# Patient Record
Sex: Female | Born: 1955 | Race: White | Hispanic: No | Marital: Married | Smoking: Never smoker
Health system: Southern US, Community
[De-identification: ages and names within clinical notes are randomized; demographics above are authoritative.]

## PROBLEM LIST (undated history)

## (undated) DIAGNOSIS — B373 Candidiasis of vulva and vagina: Secondary | ICD-10-CM

## (undated) DIAGNOSIS — R569 Unspecified convulsions: Secondary | ICD-10-CM

## (undated) DIAGNOSIS — B3731 Acute candidiasis of vulva and vagina: Secondary | ICD-10-CM

## (undated) DIAGNOSIS — E119 Type 2 diabetes mellitus without complications: Secondary | ICD-10-CM

## (undated) DIAGNOSIS — K219 Gastro-esophageal reflux disease without esophagitis: Secondary | ICD-10-CM

## (undated) DIAGNOSIS — F32A Depression, unspecified: Secondary | ICD-10-CM

## (undated) DIAGNOSIS — Z9114 Patient's other noncompliance with medication regimen: Secondary | ICD-10-CM

## (undated) DIAGNOSIS — F329 Major depressive disorder, single episode, unspecified: Secondary | ICD-10-CM

## (undated) DIAGNOSIS — I10 Essential (primary) hypertension: Secondary | ICD-10-CM

## (undated) DIAGNOSIS — Z91148 Patient's other noncompliance with medication regimen for other reason: Secondary | ICD-10-CM

## (undated) DIAGNOSIS — E785 Hyperlipidemia, unspecified: Secondary | ICD-10-CM

## (undated) DIAGNOSIS — K851 Biliary acute pancreatitis without necrosis or infection: Secondary | ICD-10-CM

## (undated) DIAGNOSIS — M5020 Other cervical disc displacement, unspecified cervical region: Secondary | ICD-10-CM

## (undated) DIAGNOSIS — F419 Anxiety disorder, unspecified: Secondary | ICD-10-CM

## (undated) HISTORY — DX: Major depressive disorder, single episode, unspecified: F32.9

## (undated) HISTORY — PX: TUBAL LIGATION: SHX77

## (undated) HISTORY — DX: Type 2 diabetes mellitus without complications: E11.9

## (undated) HISTORY — DX: Essential (primary) hypertension: I10

## (undated) HISTORY — DX: Patient's other noncompliance with medication regimen for other reason: Z91.148

## (undated) HISTORY — DX: Candidiasis of vulva and vagina: B37.3

## (undated) HISTORY — DX: Depression, unspecified: F32.A

## (undated) HISTORY — DX: Hyperlipidemia, unspecified: E78.5

## (undated) HISTORY — DX: Anxiety disorder, unspecified: F41.9

## (undated) HISTORY — DX: Other cervical disc displacement, unspecified cervical region: M50.20

## (undated) HISTORY — DX: Biliary acute pancreatitis without necrosis or infection: K85.10

## (undated) HISTORY — DX: Unspecified convulsions: R56.9

## (undated) HISTORY — DX: Gastro-esophageal reflux disease without esophagitis: K21.9

## (undated) HISTORY — PX: CHOLECYSTECTOMY: SHX55

## (undated) HISTORY — DX: Patient's other noncompliance with medication regimen: Z91.14

## (undated) HISTORY — DX: Acute candidiasis of vulva and vagina: B37.31

---

## 2004-07-10 ENCOUNTER — Ambulatory Visit: Payer: Self-pay | Admitting: Obstetrics and Gynecology

## 2006-03-17 ENCOUNTER — Ambulatory Visit: Payer: Self-pay | Admitting: Family Medicine

## 2006-06-21 ENCOUNTER — Ambulatory Visit: Payer: Self-pay | Admitting: Family Medicine

## 2007-03-09 ENCOUNTER — Other Ambulatory Visit: Payer: Self-pay

## 2007-03-10 ENCOUNTER — Inpatient Hospital Stay: Payer: Self-pay | Admitting: Internal Medicine

## 2009-03-20 ENCOUNTER — Ambulatory Visit: Payer: Self-pay

## 2011-03-17 ENCOUNTER — Ambulatory Visit: Payer: Self-pay | Admitting: Family Medicine

## 2011-10-26 ENCOUNTER — Ambulatory Visit: Payer: Self-pay | Admitting: Family Medicine

## 2013-03-26 ENCOUNTER — Ambulatory Visit: Payer: Self-pay | Admitting: Family Medicine

## 2013-11-27 ENCOUNTER — Emergency Department: Payer: Self-pay | Admitting: Internal Medicine

## 2013-11-27 LAB — CBC AND DIFFERENTIAL
HEMATOCRIT: 41 % (ref 36–46)
Hemoglobin: 13.6 g/dL (ref 12.0–16.0)
PLATELETS: 142 10*3/uL — AB (ref 150–399)
WBC: 12.9 10^3/mL

## 2013-11-27 LAB — COMPREHENSIVE METABOLIC PANEL
ALBUMIN: 4.3 g/dL (ref 3.4–5.0)
ALK PHOS: 53 U/L
Anion Gap: 13 (ref 7–16)
BILIRUBIN TOTAL: 0.4 mg/dL (ref 0.2–1.0)
BUN: 12 mg/dL (ref 7–18)
CALCIUM: 9 mg/dL (ref 8.5–10.1)
CHLORIDE: 102 mmol/L (ref 98–107)
Co2: 22 mmol/L (ref 21–32)
Creatinine: 1.18 mg/dL (ref 0.60–1.30)
EGFR (Non-African Amer.): 51 — ABNORMAL LOW
Glucose: 96 mg/dL (ref 65–99)
Osmolality: 273 (ref 275–301)
Potassium: 4 mmol/L (ref 3.5–5.1)
SGOT(AST): 23 U/L (ref 15–37)
SGPT (ALT): 36 U/L
SODIUM: 137 mmol/L (ref 136–145)
Total Protein: 7.8 g/dL (ref 6.4–8.2)

## 2013-11-27 LAB — URINALYSIS, COMPLETE
Bacteria: NONE SEEN
Bilirubin,UR: NEGATIVE
Blood: NEGATIVE
GLUCOSE, UR: NEGATIVE mg/dL (ref 0–75)
Hyaline Cast: 21
LEUKOCYTE ESTERASE: NEGATIVE
Nitrite: NEGATIVE
Ph: 5 (ref 4.5–8.0)
RBC,UR: 1 /HPF (ref 0–5)
Specific Gravity: 1.025 (ref 1.003–1.030)
WBC UR: 2 /HPF (ref 0–5)

## 2013-11-27 LAB — DRUG SCREEN, URINE
Amphetamines, Ur Screen: NEGATIVE (ref ?–1000)
BARBITURATES, UR SCREEN: NEGATIVE (ref ?–200)
Benzodiazepine, Ur Scrn: NEGATIVE (ref ?–200)
CANNABINOID 50 NG, UR ~~LOC~~: POSITIVE (ref ?–50)
Cocaine Metabolite,Ur ~~LOC~~: NEGATIVE (ref ?–300)
MDMA (Ecstasy)Ur Screen: POSITIVE (ref ?–500)
Methadone, Ur Screen: NEGATIVE (ref ?–300)
Opiate, Ur Screen: NEGATIVE (ref ?–300)
Phencyclidine (PCP) Ur S: NEGATIVE (ref ?–25)
TRICYCLIC, UR SCREEN: NEGATIVE (ref ?–1000)

## 2013-11-27 LAB — BASIC METABOLIC PANEL
BUN: 12 mg/dL (ref 4–21)
CREATININE: 1.1 mg/dL (ref <=1.1)
Glucose: 96 mg/dL
POTASSIUM: 4 mmol/L (ref 3.4–5.3)
SODIUM: 137 mmol/L (ref 137–147)

## 2013-11-27 LAB — CBC
HCT: 41.3 % (ref 35.0–47.0)
HGB: 13.6 g/dL (ref 12.0–16.0)
MCH: 30.1 pg (ref 26.0–34.0)
MCHC: 33 g/dL (ref 32.0–36.0)
MCV: 91 fL (ref 80–100)
PLATELETS: 263 10*3/uL (ref 150–440)
RBC: 4.52 10*6/uL (ref 3.80–5.20)
RDW: 12.6 % (ref 11.5–14.5)
WBC: 12.9 10*3/uL — ABNORMAL HIGH (ref 3.6–11.0)

## 2013-11-27 LAB — ACETAMINOPHEN LEVEL: Acetaminophen: <2 ug/mL

## 2013-11-27 LAB — HEPATIC FUNCTION PANEL
Alkaline Phosphatase: 53 U/L (ref 25–125)
Bilirubin, Total: 0.4 mg/dL

## 2013-11-27 LAB — ETHANOL

## 2013-11-27 LAB — TROPONIN I: Troponin-I: <0.02 ng/mL

## 2013-11-27 LAB — LIPASE, BLOOD: Lipase: 142 U/L (ref 73–393)

## 2013-11-27 LAB — SALICYLATE LEVEL: SALICYLATES, SERUM: 3.7 mg/dL — AB

## 2013-12-06 LAB — HEMOGLOBIN A1C: Hgb A1c MFr Bld: 6.5 % — AB (ref 4.0–6.0)

## 2014-01-07 ENCOUNTER — Emergency Department: Payer: Self-pay | Admitting: Emergency Medicine

## 2014-01-07 LAB — CBC WITH DIFFERENTIAL/PLATELET
Basophil #: 0.1 10*3/uL (ref 0.0–0.1)
Basophil %: 0.5 %
Eosinophil #: 0.2 10*3/uL (ref 0.0–0.7)
Eosinophil %: 2.4 %
HCT: 43.1 % (ref 35.0–47.0)
HGB: 14 g/dL (ref 12.0–16.0)
Lymphocyte #: 4.1 10*3/uL — ABNORMAL HIGH (ref 1.0–3.6)
Lymphocyte %: 41.3 %
MCH: 30.1 pg (ref 26.0–34.0)
MCHC: 32.5 g/dL (ref 32.0–36.0)
MCV: 93 fL (ref 80–100)
Monocyte #: 0.7 x10 3/mm (ref 0.2–0.9)
Monocyte %: 7.2 %
NEUTROS ABS: 4.8 10*3/uL (ref 1.4–6.5)
NEUTROS PCT: 48.6 %
PLATELETS: 237 10*3/uL (ref 150–440)
RBC: 4.66 10*6/uL (ref 3.80–5.20)
RDW: 12.6 % (ref 11.5–14.5)
WBC: 9.9 10*3/uL (ref 3.6–11.0)

## 2014-01-07 LAB — BASIC METABOLIC PANEL
ANION GAP: 13 (ref 7–16)
BUN: 11 mg/dL (ref 7–18)
CO2: 24 mmol/L (ref 21–32)
Calcium, Total: 9.3 mg/dL (ref 8.5–10.1)
Chloride: 101 mmol/L (ref 98–107)
Creatinine: 1.1 mg/dL (ref 0.60–1.30)
EGFR (African American): >60
EGFR (Non-African Amer.): 55 — ABNORMAL LOW
GLUCOSE: 116 mg/dL — AB (ref 65–99)
Osmolality: 276 (ref 275–301)
Potassium: 3.6 mmol/L (ref 3.5–5.1)
Sodium: 138 mmol/L (ref 136–145)

## 2014-01-15 ENCOUNTER — Inpatient Hospital Stay: Payer: Self-pay | Admitting: Internal Medicine

## 2014-01-15 ENCOUNTER — Ambulatory Visit: Payer: Self-pay | Admitting: Family Medicine

## 2014-01-15 LAB — COMPREHENSIVE METABOLIC PANEL
ALK PHOS: 54 U/L
Albumin: 4.2 g/dL (ref 3.4–5.0)
Anion Gap: 13 (ref 7–16)
BILIRUBIN TOTAL: 0.4 mg/dL (ref 0.2–1.0)
BUN: 13 mg/dL (ref 7–18)
CALCIUM: 9.6 mg/dL (ref 8.5–10.1)
CHLORIDE: 96 mmol/L — AB (ref 98–107)
CO2: 27 mmol/L (ref 21–32)
Creatinine: 0.9 mg/dL (ref 0.60–1.30)
GLUCOSE: 141 mg/dL — AB (ref 65–99)
OSMOLALITY: 274 (ref 275–301)
Potassium: 4.1 mmol/L (ref 3.5–5.1)
SGOT(AST): 29 U/L (ref 15–37)
SGPT (ALT): 41 U/L
Sodium: 136 mmol/L (ref 136–145)
Total Protein: 8.3 g/dL — ABNORMAL HIGH (ref 6.4–8.2)

## 2014-01-15 LAB — CBC WITH DIFFERENTIAL/PLATELET
Basophil #: 0.1 10*3/uL (ref 0.0–0.1)
Basophil %: 0.6 %
EOS PCT: 2.4 %
Eosinophil #: 0.2 10*3/uL (ref 0.0–0.7)
HCT: 46.5 % (ref 35.0–47.0)
HGB: 15.5 g/dL (ref 12.0–16.0)
LYMPHS ABS: 3.3 10*3/uL (ref 1.0–3.6)
Lymphocyte %: 38 %
MCH: 29.9 pg (ref 26.0–34.0)
MCHC: 33.4 g/dL (ref 32.0–36.0)
MCV: 90 fL (ref 80–100)
Monocyte #: 0.6 x10 3/mm (ref 0.2–0.9)
Monocyte %: 7.3 %
NEUTROS ABS: 4.5 10*3/uL (ref 1.4–6.5)
Neutrophil %: 51.7 %
PLATELETS: 244 10*3/uL (ref 150–440)
RBC: 5.19 10*6/uL (ref 3.80–5.20)
RDW: 12.2 % (ref 11.5–14.5)
WBC: 8.7 10*3/uL (ref 3.6–11.0)

## 2014-01-15 LAB — URINALYSIS, COMPLETE
Bilirubin,UR: NEGATIVE
Blood: NEGATIVE
Glucose,UR: NEGATIVE
KETONE: NEGATIVE
Leukocyte Esterase: NEGATIVE
Nitrite: NEGATIVE
PH: 5.5 (ref 5.0–8.0)
Specific Gravity: >=1.03 (ref 1.000–1.030)

## 2014-01-15 LAB — LIPASE, BLOOD: Lipase: 516 U/L — ABNORMAL HIGH (ref 73–393)

## 2014-01-16 LAB — BASIC METABOLIC PANEL
ANION GAP: 4 — AB (ref 7–16)
BUN: 13 mg/dL (ref 7–18)
Calcium, Total: 8.2 mg/dL — ABNORMAL LOW (ref 8.5–10.1)
Chloride: 104 mmol/L (ref 98–107)
Co2: 30 mmol/L (ref 21–32)
Creatinine: 0.9 mg/dL (ref 0.60–1.30)
EGFR (African American): >60
EGFR (Non-African Amer.): >60
GLUCOSE: 96 mg/dL (ref 65–99)
Osmolality: 276 (ref 275–301)
Potassium: 3.8 mmol/L (ref 3.5–5.1)
SODIUM: 138 mmol/L (ref 136–145)

## 2014-01-16 LAB — LIPASE, BLOOD: Lipase: 69 U/L — ABNORMAL LOW (ref 73–393)

## 2014-01-21 ENCOUNTER — Observation Stay: Payer: Self-pay | Admitting: Surgery

## 2014-01-21 LAB — COMPREHENSIVE METABOLIC PANEL
ALBUMIN: 3.8 g/dL (ref 3.4–5.0)
ALK PHOS: 46 U/L
Anion Gap: 7 (ref 7–16)
BUN: 9 mg/dL (ref 7–18)
Bilirubin,Total: 0.4 mg/dL (ref 0.2–1.0)
Calcium, Total: 9.3 mg/dL (ref 8.5–10.1)
Chloride: 101 mmol/L (ref 98–107)
Co2: 29 mmol/L (ref 21–32)
Creatinine: 0.81 mg/dL (ref 0.60–1.30)
EGFR (African American): >60
EGFR (Non-African Amer.): >60
GLUCOSE: 110 mg/dL — AB (ref 65–99)
OSMOLALITY: 273 (ref 275–301)
POTASSIUM: 4 mmol/L (ref 3.5–5.1)
SGOT(AST): 27 U/L (ref 15–37)
SGPT (ALT): 35 U/L
Sodium: 137 mmol/L (ref 136–145)
Total Protein: 7.8 g/dL (ref 6.4–8.2)

## 2014-01-21 LAB — CBC WITH DIFFERENTIAL/PLATELET
Basophil #: 0.1 10*3/uL (ref 0.0–0.1)
Basophil %: 0.9 %
EOS ABS: 0.2 10*3/uL (ref 0.0–0.7)
Eosinophil %: 2.9 %
HCT: 43.5 % (ref 35.0–47.0)
HGB: 14.7 g/dL (ref 12.0–16.0)
LYMPHS ABS: 3 10*3/uL (ref 1.0–3.6)
LYMPHS PCT: 43.8 %
MCH: 30.5 pg (ref 26.0–34.0)
MCHC: 33.8 g/dL (ref 32.0–36.0)
MCV: 90 fL (ref 80–100)
MONO ABS: 0.5 x10 3/mm (ref 0.2–0.9)
Monocyte %: 7.5 %
NEUTROS PCT: 44.9 %
Neutrophil #: 3.1 10*3/uL (ref 1.4–6.5)
PLATELETS: 231 10*3/uL (ref 150–440)
RBC: 4.81 10*6/uL (ref 3.80–5.20)
RDW: 12.6 % (ref 11.5–14.5)
WBC: 7 10*3/uL (ref 3.6–11.0)

## 2014-01-21 LAB — URINALYSIS, COMPLETE
BACTERIA: NONE SEEN
BLOOD: NEGATIVE
Bilirubin,UR: NEGATIVE
Glucose,UR: NEGATIVE mg/dL (ref 0–75)
Ketone: NEGATIVE
Leukocyte Esterase: NEGATIVE
Nitrite: NEGATIVE
PROTEIN: NEGATIVE
Ph: 5 (ref 4.5–8.0)
Specific Gravity: 1.013 (ref 1.003–1.030)
Squamous Epithelial: 2
WBC UR: 1 /HPF (ref 0–5)

## 2014-01-21 LAB — LIPASE, BLOOD: LIPASE: 79 U/L (ref 73–393)

## 2014-02-27 ENCOUNTER — Encounter (INDEPENDENT_AMBULATORY_CARE_PROVIDER_SITE_OTHER): Payer: Self-pay

## 2014-02-27 ENCOUNTER — Telehealth: Payer: Self-pay

## 2014-02-27 ENCOUNTER — Ambulatory Visit (INDEPENDENT_AMBULATORY_CARE_PROVIDER_SITE_OTHER): Payer: No Typology Code available for payment source | Admitting: Endocrinology

## 2014-02-27 ENCOUNTER — Encounter: Payer: Self-pay | Admitting: Endocrinology

## 2014-02-27 VITALS — BP 134/82 | HR 65 | Ht 67.0 in | Wt 222.2 lb

## 2014-02-27 DIAGNOSIS — I10 Essential (primary) hypertension: Secondary | ICD-10-CM

## 2014-02-27 DIAGNOSIS — E119 Type 2 diabetes mellitus without complications: Secondary | ICD-10-CM

## 2014-02-27 LAB — COMPREHENSIVE METABOLIC PANEL
ALK PHOS: 50 U/L (ref 39–117)
ALT: 21 U/L (ref 0–35)
AST: 20 U/L (ref 0–37)
Albumin: 4.6 g/dL (ref 3.5–5.2)
BUN: 10 mg/dL (ref 6–23)
CO2: 31 meq/L (ref 19–32)
CREATININE: 0.68 mg/dL (ref 0.40–1.20)
Calcium: 10.1 mg/dL (ref 8.4–10.5)
Chloride: 102 mEq/L (ref 96–112)
GFR: 95.07 mL/min (ref >60.00)
Glucose, Bld: 48 mg/dL — CL (ref 70–99)
Potassium: 4.2 mEq/L (ref 3.5–5.1)
Sodium: 139 mEq/L (ref 135–145)
Total Bilirubin: 0.4 mg/dL (ref 0.2–1.2)
Total Protein: 7.5 g/dL (ref 6.0–8.3)

## 2014-02-27 LAB — HM DIABETES FOOT EXAM: HM Diabetic Foot Exam: NORMAL

## 2014-02-27 MED ORDER — METFORMIN HCL ER (MOD) 1000 MG PO TB24: 1000.0000 mg | ORAL_TABLET | Freq: Every day | ORAL | Status: DC

## 2014-02-27 NOTE — Progress Notes (Signed)
Reason for visit-  Sharon Crawford is a 59 y.o.-year-old female, referred by her PCP,  Dr Otilio Miu, for management of Type 2 diabetes, uncontrolled, without complications. Recent history of gallstone pancreatitis Dec 2015, following which she had CCY. Recent Seizures of unclear etiology as well, for which she is being followed by neurology. Here with husband.   HPI- Patient has been diagnosed with diabetes in 2006. Recalls being initially on lifestyle modifications. Prior hx non compliance.   she has not been on insulin before.   Was having GI upset , worse in past 2 years>>seizure Nov 2015 with no clear precipitant, ?mild hypoglycemia that time ( 69, 80s)>> then developed right sided abdominal pain with pancreatitis, gall stones  >>underwent GB surgery Dec 2015,>>surgery recommended restarting met, glip jan 2016 ( meds were held during hospitalization) and since then has not been tolerating the medication Sees Dr Gurney Maxin at [facility] neurology for seizures Developed Depression after her drivers license got taken away.     Pt is currently on a regimen of: - Metformin 1000 mg po bid - Glipizide 10 mg BID * GI upset, bloating, cramping , made worse after CCY surgery *uses marijuana daily smokes for nausea, relaxation    Last hemoglobin A1c was: Lab Results  Component Value Date   HGBA1C 6.5* 12/06/2013     Pt checks her sugars <1 a day ( when symptoms)  . Uses [employer] glucometer. By recall they are:  PREMEAL Breakfast Lunch Dinner Bedtime Overall  Glucose range: 80-125   80-300s   Mean/median:        POST-MEAL PC Breakfast PC Lunch PC Dinner  Glucose range:  175-180   Mean/median:       Hypoglycemia-  No lows. Lowest sugar was 70s-Dec 2015 around the time of seizures- happens early pm; she has hypoglycemia awareness at 70.   Dietary habits- eats three times daily.  Gets busy and forgets to eat lunch time- now getting better. Eating fewer carbs lately. Not  controlling portions on desserts and almost has something daily.  Exercise- not lately Weight -  Wt Readings from Last 3 Encounters:  02/27/14 222 lb 4 oz (100.812 kg)    Diabetes Complications-  Nephropathy- No  CKD, last BUN/creatinine-  Lab Results  Component Value Date   BUN 10 02/27/2014   CREATININE 0.68 02/27/2014   Lab Results  Component Value Date   GFR 95.07 02/27/2014     Retinopathy- No, Last DEE was in 2015 Neuropathy- occ numbness and tingling in her feet. No known neuropathy.  Associated history - No CAD . No prior stroke. No hypothyroidism. her last TSH was No results found for: TSH  Hyperlipidemia-  her last set of lipids were- Currently on dietary therapy. Tolerating well.  No results found for: CHOL, HDL, LDLCALC, LDLDIRECT, TRIG, CHOLHDL  Blood Pressure/HTN- Patient's blood pressure is well controlled today on current regimen that includes ACE-I.  Pt has FH of DM in father, sisters, brother.  I have reviewed the patient's past medical history, family and social history, surgical history, medications and allergies.  Past Medical History  Diagnosis Date  . Diabetes mellitus without complication   . Hyperlipidemia   . Anxiety   . Hypertension   . Seizures   . GERD (gastroesophageal reflux disease)   . Depression   . Noncompliance with medication regimen   . Vulvovaginal candidiasis   . Cervical disc displacement   . Gallstone pancreatitis    Past Surgical History  Procedure  Laterality Date  . Cholecystectomy    . Tubal ligation     Family History  Problem Relation Age of Onset  . Diabetes Father   . Diabetes Sister   . Diabetes Brother    History   Social History  . Marital Status: Married    Spouse Name: N/A    Number of Children: N/A  . Years of Education: N/A   Occupational History  . Not on file.   Social History Main Topics  . Smoking status: Never Smoker   . Smokeless tobacco: Never Used  . Alcohol Use: No  . Drug Use: 7.00  per week    Special: Marijuana     Comment: patient reports smoke marijuana daily  . Sexual Activity: Not on file   Other Topics Concern  . Not on file   Social History Narrative   No current outpatient prescriptions on file prior to visit.   No current facility-administered medications on file prior to visit.   Allergies  Allergen Reactions  . Trazodone     Other reaction(s): Other (See Comments) Insomnia     Review of Systems: _0  complains of  [  ] denies General:   [ x ] Recent weight change [ x ] Fatigue  [  ] Loss of appetite Eyes: [ x ]  Vision Difficulty [  ]  Eye pain ENT: [  ]  Hearing difficulty [  ]  Difficulty Swallowing CVS: [  ] Chest pain [  ]  Palpitations/Irregular Heart beat [  ]  Shortness of breath lying flat [  ] Swelling of legs Resp: [  ] Frequent Cough [  ] Shortness of Breath  [  ]  Wheezing GI: [ x ] Heartburn  [  x] Nausea or Vomiting  [  ] Diarrhea [  ] Constipation  [ x ] Abdominal Pain GU: [  ]  Polyuria  [ x ]  nocturia Bones/joints:  [  ]  Muscle aches  [ x ] Joint Pain  [  ] Bone pain Skin/Hair/Nails: [  ]  Rash  [  ] New stretch marks [  ]  Itching [ x ] Hair loss [  ]  Excessive hair growth Reproduction: [ x ] Low sexual desire , [  ]  Women: Menstrual cycle problems [  ]  Women: Breast Discharge [  ] Men: Difficulty with erections [  ]  Men: Enlarged Breasts CNS: [  ] Frequent Headaches [  ] Blurry vision [ x ] Tremors [ x ] Seizures [ x ] Loss of consciousness [  ] Localized weakness Endocrine: [  ]  Excess thirst [  ]  Feeling excessively hot [  ]  Feeling excessively cold Heme: [  ]  Easy bruising [  ]  Enlarged glands or lumps in neck Allergy: [  ]  Food allergies [  ] Environmental allergies  PE: BP 134/82 mmHg  Pulse 65  Ht _1  (1.702 m)  Wt 222 lb 4 oz (100.812 kg)  BMI 34.80 kg/m2  SpO2 97% Wt Readings from Last 3 Encounters:  02/27/14 222 lb 4 oz (100.812 kg)   GENERAL: No acute distress, well developed HEENT:  Eye exam  shows normal external appearance. Oral exam shows normal mucosa .  NECK:   Neck exam shows no lymphadenopathy. No Carotids bruits. Thyroid is not enlarged and no nodules felt.  no acanthosis nigricans LUNGS:  Chest is symmetrical. Lungs are clear to auscultation.Marland Kitchen   HEART:         Heart sounds:  S1 and S2 are normal. Systolic murmur+ ABDOMEN:  No Distention present. Liver and spleen are not palpable. No other mass or tenderness present.  EXTREMITIES:     There is no edema. 2+ DP pulses  NEUROLOGICAL:     Grossly intact.            Diabetic foot exam done with shoes and socks removed: Normal Monofilament testing bilaterally. No deformities of toes.  Nails  Not dystrophic. Skin normal color. No open wounds. Dry skin. Few calluses. MUSCULOSKELETAL:       There is no enlargement or gross deformity of the joints.  SKIN:       No rash  ASSESSMENT AND PLAN: Problem List Items Addressed This Visit      Cardiovascular and Mediastinum   Essential hypertension    BP at target today. Will assess urine MA at next few visits.        Endocrine   Type 2 diabetes mellitus - Primary    Discussed pathophysiology of DM, goal sugars, A1c, long term risks of uncontrolled DM.  Discussed dietary modifications, exercise, weight loss.  Foot care, eye exams and hypoglycemia recognition and treatment.  Asked her to check her sugars 2x daily.  She hasn't been tolerating the metformin specially after her CCY, and I will change that to [facility] to see if she tolerates that better.  Can continue Glipizide at current dose for now, but worried that she has unrecognized hypoglycemia and if she does not note any lows, then she is aware to notify me for further adjustments.  Try controling portions for desserts and add walk every day.   RTC 1 month      Relevant Medications   metFORMIN (GLUMETZA) 24 hr tablet   Other Relevant Orders   Comprehensive metabolic panel (Completed)        - Return to clinic  in 1 mo with sugar log/meter. 45 minutes spent with the patient and her husband, discussing above topics>50% time.   Kamilla Hands [facility] 02/28/2014 9:38 AM

## 2014-02-27 NOTE — Telephone Encounter (Signed)
She is not tolerating the metformin. Need glumetza. Do we need to do a PA form?

## 2014-02-27 NOTE — Progress Notes (Signed)
Pre visit review using our clinic review tool, if applicable. No additional management support is needed unless otherwise documented below in the visit note. 

## 2014-02-27 NOTE — Telephone Encounter (Signed)
Received a call from [facility] pharmacy tech stating that patient's insurance will not cover the brand name glumetza, they will only cover the genetic brand metformin. Patient can not afford out of pocket cost. Please advise.

## 2014-02-27 NOTE — Telephone Encounter (Signed)
I will try doing the PA online.

## 2014-02-27 NOTE — Patient Instructions (Signed)
Check sugars 2 x daily ( before breakfast and before supper).  Record them in a log book and bring that/meter to next appointment.   Stop metformin.  Start Glumetza at 1000 mg at night - in another 10 days, increase it to 1000 mg twice daily as tolerated.   Notify if you get lows <90.  Labs today.  Please come back for a follow-up appointment in 1 month.

## 2014-02-27 NOTE — Telephone Encounter (Signed)
PA has been submitted to patient's insurance company. Awaiting response at this time.

## 2014-02-28 ENCOUNTER — Encounter: Payer: Self-pay | Admitting: Endocrinology

## 2014-02-28 DIAGNOSIS — I10 Essential (primary) hypertension: Secondary | ICD-10-CM | POA: Insufficient documentation

## 2014-02-28 DIAGNOSIS — K219 Gastro-esophageal reflux disease without esophagitis: Secondary | ICD-10-CM | POA: Insufficient documentation

## 2014-02-28 DIAGNOSIS — R569 Unspecified convulsions: Secondary | ICD-10-CM | POA: Insufficient documentation

## 2014-02-28 DIAGNOSIS — F329 Major depressive disorder, single episode, unspecified: Secondary | ICD-10-CM | POA: Insufficient documentation

## 2014-02-28 DIAGNOSIS — F32A Depression, unspecified: Secondary | ICD-10-CM | POA: Insufficient documentation

## 2014-02-28 NOTE — Assessment & Plan Note (Signed)
Discussed pathophysiology of DM, goal sugars, A1c, long term risks of uncontrolled DM.  Discussed dietary modifications, exercise, weight loss.  Foot care, eye exams and hypoglycemia recognition and treatment.  Asked her to check her sugars 2x daily.  She hasn't been tolerating the metformin specially after her CCY, and I [facility] to see if she tolerates that better.  Can continue Glipizide at current dose for now, but worried that she has unrecognized hypoglycemia and if she does not note any lows, then she is aware to notify me for further adjustments.  Try controling portions for desserts and add walk every day.   RTC 1 month

## 2014-02-28 NOTE — Assessment & Plan Note (Signed)
BP at target today. Will assess urine MA at next few visits.

## 2014-03-04 NOTE — Telephone Encounter (Signed)
Can you please check up on the status of this PA?

## 2014-03-04 NOTE — Telephone Encounter (Signed)
Pa for Sharon KirschnerGlumetza has been approved spoke to [employer] rep stated that PA is good from 02/28/14 through 03/01/15.

## 2014-03-04 NOTE — Telephone Encounter (Signed)
Patient has been notified PA approved and can picked up from pharmacy today. Patient verbalized understanding.

## 2014-03-04 NOTE — Telephone Encounter (Signed)
Great, please could you notify patient so that she could start the med. thanks

## 2014-03-06 ENCOUNTER — Telehealth: Payer: Self-pay

## 2014-03-06 NOTE — Telephone Encounter (Signed)
I think she will have this issue with any medication that we prescribe her. Deductibles have to be met prior to cost coverage for medicines. Please ask her to discuss with her insurance though why they are not bringing down the cost of Glumetza since recent PA was approved and she hasn't tolerated the Metformin in the past. In the meantime, she can continue on Glipizide alone  thanks

## 2014-03-06 NOTE — Telephone Encounter (Signed)
Spoke to patient who stated that the glumetza is $400 and she can not afford that. Pa was approved but medication is a tier 4 drug so patient has to meet her deductible before insurance will cover any cost. Please advise.

## 2014-03-06 NOTE — Telephone Encounter (Signed)
Spoke to patient to notify her of comments from pharmacy and Dr. Welford RochePhadke. Patient verbalized understanding and stated that she will call her insurance company to figure it all out.

## 2014-03-06 NOTE — Telephone Encounter (Signed)
The patient called and stated she is unable to purchase the medications he was prescribed.   Pt callback - [phone]

## 2014-03-07 ENCOUNTER — Encounter: Payer: Self-pay | Admitting: Endocrinology

## 2014-03-28 ENCOUNTER — Encounter: Payer: Self-pay | Admitting: Endocrinology

## 2014-03-28 ENCOUNTER — Ambulatory Visit (INDEPENDENT_AMBULATORY_CARE_PROVIDER_SITE_OTHER): Payer: No Typology Code available for payment source | Admitting: Endocrinology

## 2014-03-28 VITALS — BP 120/78 | HR 60 | Resp 14 | Ht 67.0 in | Wt 220.8 lb

## 2014-03-28 DIAGNOSIS — E119 Type 2 diabetes mellitus without complications: Secondary | ICD-10-CM

## 2014-03-28 DIAGNOSIS — I10 Essential (primary) hypertension: Secondary | ICD-10-CM

## 2014-03-28 MED ORDER — METFORMIN HCL ER (MOD) 500 MG PO TB24: ORAL_TABLET | ORAL | Status: DC

## 2014-03-28 NOTE — Progress Notes (Signed)
Reason for visit-  Sharon Crawford is a 59 y.o.-year-old female,  for management of Type 2 diabetes, uncontrolled, without complications. Recent history of gallstone pancreatitis Dec 2015, following which she had CCY. Recent Seizures of unclear etiology as well, for which she is being followed by neurology. Here with husband.  Last visit 1 month ago.    HPI- Patient has been diagnosed with diabetes in 2006. Recalls being initially on lifestyle modifications. Prior hx non compliance.   she has not been on insulin before.   Was having GI upset , worse in past 2 years>>seizure Nov 2015 with no clear precipitant, ?mild hypoglycemia that time ( 69, 80s)>> then developed right sided abdominal pain with pancreatitis, gall stones  >>underwent GB surgery Dec 2015,>>surgery recommended restarting met, glip jan 2016 ( meds were held during hospitalization) and since then has not been tolerating the medication Sees Dr Gurney Maxin at [facility] neurology for seizures Developed Depression after her drivers license got taken away.    * Glumetza too expensive as Tier 4 drug but hadn't met deductible. Was switched from Metformin to [place] due to GI side effects>>still taking it now on a trial basis  * GI upset, bloating, cramping , made worse after CCY surgery  *uses marijuana daily smokes for nausea, relaxation  Pt is currently on a regimen of: - Glumetza 500 mg twice daily - Glipizide 5 mg BID>> dose decreased from 10 mg BID due to possibility of unrecognized hypoglycemia during the day     Last hemoglobin A1c was: Lab Results  Component Value Date   HGBA1C 6.7* 03/28/2014   HGBA1C 6.5* 12/06/2013     Pt checks her sugars <1 a day ( when symptoms)  . Uses [employer] glucometer. By recall they are:  PREMEAL Breakfast Lunch Dinner Bedtime Overall  Glucose range: 88-157 92-166 61-146 60-155   Mean/median:        POST-MEAL PC Breakfast PC Lunch PC Dinner  Glucose range:     Mean/median:        Hypoglycemia-  Some lows. Lowest sugar was 60s- freq decreased after reduction in Glipizide; she has hypoglycemia awareness at 70.   Dietary habits- eats three times daily.  Gets busy and forgets to eat lunch time- now getting better. Eating fewer carbs lately. Working on controlling portions on desserts and almost has something daily.  Exercise- not lately Weight -  Wt Readings from Last 3 Encounters:  03/28/14 220 lb 12 oz (100.132 kg)  02/27/14 222 lb 4 oz (100.812 kg)    Diabetes Complications-  Nephropathy- No  CKD, last BUN/creatinine-  Lab Results  Component Value Date   BUN 10 02/27/2014   CREATININE 0.68 02/27/2014   Lab Results  Component Value Date   GFR 95.07 02/27/2014   No results found for: MICRALBCREAT    Retinopathy- No, Last DEE was in 2015 Neuropathy- occ numbness and tingling in her feet. No known neuropathy.  Associated history - No CAD . No prior stroke. No hypothyroidism. her last TSH was No results found for: TSH  Hyperlipidemia-  her last set of lipids were- Currently on dietary therapy. Tolerating well.  No results found for: CHOL, HDL, LDLCALC, LDLDIRECT, TRIG, CHOLHDL  Blood Pressure/HTN- Patient's blood pressure is well controlled today on current regimen that includes ACE-I.   I have reviewed the patient's past medical history,  medications and allergies.   Current Outpatient Prescriptions on File Prior to Visit  Medication Sig Dispense Refill  . citalopram (CELEXA) 40  MG tablet Take 40 mg by mouth daily.  1  . DULoxetine (CYMBALTA) 30 MG capsule Take 30 mg by mouth daily.    Marland Kitchen estradiol (ESTRACE) 0.5 MG tablet Take 0.5 mg by mouth at bedtime.  2  . estrogen, conjugated,-medroxyprogesterone (PREMPRO) 0.625-2.5 MG per tablet Take 1 tablet by mouth daily.    Marland Kitchen glipiZIDE (GLUCOTROL) 10 MG tablet Take 5 mg by mouth daily.  1  . levETIRAcetam (KEPPRA) 750 MG tablet Take 750 mg by mouth 2 (two) times daily.    Marland Kitchen lisinopril (PRINIVIL,ZESTRIL)  2.5 MG tablet Take 2.5 mg by mouth daily.  3  . omeprazole (PRILOSEC) 20 MG capsule Take 20 mg by mouth daily.  6  . propranolol (INDERAL) 20 MG tablet Take 20 mg by mouth 2 (two) times daily.  6   No current facility-administered medications on file prior to visit.   Allergies  Allergen Reactions  . Trazodone     Other reaction(s): Other (See Comments) Insomnia    Review of Systems- [ x ]  Complains of    [  ]  denies [  ] Recent weight change [  ]  Fatigue [  ] polydipsia [  ] polyuria [  ]  nocturia [  ]  vision difficulty [  ] chest pain [  ] shortness of breath [  ] leg swelling [  ] cough [  ] nausea/vomiting [  ] diarrhea [  ] constipation [  ] abdominal pain [  ]  tingling/numbness in extremities [  ]  concern with feet ( wounds/sores)   PE: BP 120/78 mmHg  Pulse 60  Resp 14  Ht 5' 7"  (1.702 m)  Wt 220 lb 12 oz (100.132 kg)  BMI 34.57 kg/m2  SpO2 97% Wt Readings from Last 3 Encounters:  03/28/14 220 lb 12 oz (100.132 kg)  02/27/14 222 lb 4 oz (100.812 kg)   Exam: deferred  ASSESSMENT AND PLAN: Problem List Items Addressed This Visit      Cardiovascular and Mediastinum   Essential hypertension    BP at target today. Will assess urine MA at next few visits.          Endocrine   Type 2 diabetes mellitus - Primary     Asked her to check her sugars 3x daily.  She hasn't been tolerating the metformin specially after her CCY, and hence was changed to [employer]. She is not sure of how much it will cost her going forward as she has now met her deductible. If it too expensive then will try Metformin Er to see if better tolerated as well. Change Glumetza to 500 mg am and 1000 mg at night.  Change Glipizide to 5 mg evening only. Will slowly wean that off over time, while increasing metformin ER/Glumetza if tolerated. She is aware to notify me if she continues to have hypoglycemia in the interim, so that meds could be adjusted.        Relevant Medications   metFORMIN  (GLUMETZA) 24 hr tablet   Other Relevant Orders   Hemoglobin A1c (Completed)        - Return to clinic in3 mo with sugar log/meter. Patient also reports that I am covered under Tier 2 under their insurance plan and [facility] is Tier 1. Considering switch to other providers due to this or will consider switch in insurance plan since this is not the one they wanted to sign up for.  25 minutes spent with  the patient,>50% time spent on discussion of above topics and differences in various metformin preparations.    Aymen Widrig [facility] 03/29/2014 2:11 PM

## 2014-03-28 NOTE — Progress Notes (Signed)
Pre visit review using our clinic review tool, if applicable. No additional management support is needed unless otherwise documented below in the visit note. 

## 2014-03-28 NOTE — Patient Instructions (Addendum)
Check sugars 3 times daily ( fasting and premeal readings at alternating times, or at bedtime).  Record them in a sugar log and bring log and meter to next appointment.   Change Glumetza to 500 mg in the morning and 1000 mg at night.  Change Glipizide to 5mg  in the evening only.   Notify if lows  Update A1c today.  Please come back for a follow-up appointment in 3 months.

## 2014-03-29 LAB — HEMOGLOBIN A1C: HEMOGLOBIN A1C: 6.7 % — AB (ref 4.6–6.5)

## 2014-03-29 NOTE — Assessment & Plan Note (Signed)
  Asked her to check her sugars 3x daily.  She hasn't been tolerating the metformin specially after her CCY, and hence was changed to [employer]. She is not sure of how much it will cost her going forward as she has now met her deductible. If it too expensive then will try Metformin Er to see if better tolerated as well. Change Glumetza to 500 mg am and 1000 mg at night.  Change Glipizide to 5 mg evening only. Will slowly wean that off over time, while increasing metformin ER/Glumetza if tolerated. She is aware to notify me if she continues to have hypoglycemia in the interim, so that meds could be adjusted.

## 2014-03-29 NOTE — Assessment & Plan Note (Signed)
BP at target today. Will assess urine MA at next few visits.

## 2014-05-18 NOTE — H&P (Signed)
PATIENT NAME:  Sharon Crawford, Sharon Crawford MR#:  [id number] DATE OF BIRTH:  [birth year]  DATE OF ADMISSION:  01/15/2014  PRIMARY CARE PHYSICIAN: Duanne Limerickeanna C. Jones, MD  CHIEF COMPLAINT: Abdominal pain.   HISTORY OF PRESENTING ILLNESS: A 59 year old Caucasian female patient with history of seizures, diabetes, marijuana abuse, presented to the urgent care with 1 week of worsening abdominal pain, mainly upper abdominal pain. The patient has had some nausea and vomited once yesterday. No diarrhea. She was found to have elevated lipase levels at the urgent care. Ultrasound showing gallstones, but no cholecystitis. The patient is being admitted for acute pancreatitis, pain control and further management. The patient was recently started on Keppra for her seizures.   She used to drink alcohol in the past, but quit many years back. Lipase level is 516.   PAST MEDICAL HISTORY:  1.  Seizures. 2.  Depression. 3.  Diabetes. 4.  Tonsillectomy.  5.  Tubal ligation.  6.  Marijuana abuse.   ALLERGIES: GABAPENTIN. TRAZODONE.   SOCIAL HISTORY: The patient smokes marijuana. No alcohol. No illicit drug use. Lives at home with her husband.   HOME MEDICATIONS:  1.  Propranolol 10 mg oral 2 times a day.  2.  Prempro 0.625/2.5 mg 1 tablet daily.  3.  Omeprazole 20 mg daily.  4.  Metformin 1000 mg 2 times a day. 5.  Lisinopril 2.5 mg daily.  6.  Keppra 750 mg oral 2 times a day.  7.  Glipizide 10 mg oral 2 times a day.  8.  Cymbalta 30 mg daily.  9.  Citalopram 40 mg daily.   FAMILY HISTORY: No family history of pancreatic cancer or pancreatitis.   REVIEW OF SYSTEMS:  CONSTITUTIONAL: Complains of fatigue. EYES: No blurred vision, pain or redness.  EARS, NOSE, AND THROAT: No tinnitus, ear pain or hearing loss.  RESPIRATORY: No cough, wheeze, hemoptysis.  CARDIOVASCULAR: No chest pain, orthopnea, edema.  GASTROINTESTINAL: Has nausea, vomiting, abdominal pain.  GENITOURINARY: No dysuria or hematuria.  ENDOCRINE: No  polyuria, nocturia, thyroid problems.  HEMATOLOGIC AND LYMPHATIC: No anemia, easy bruising, bleeding.  INTEGUMENTARY: No acne, rash, or lesion.  MUSCULOSKELETAL: No back pain or arthritis.  NEUROLOGIC: No focal numbness or weakness. PSYCHIATRIC: No anxiety or depression.    PHYSICAL EXAMINATION:  VITAL SIGNS: Temperature to 97.4, pulse 65, respirations 19, blood pressure 143/77, saturating 94% on room air.  GENERAL: Obese, Caucasian female patient lying in bed. Overall seems in distress from her abdominal pain.  PSYCHIATRIC: Alert and oriented x 3, anxious.  HEENT: atraumatic, normocephalic. Oral mucosa moist and pink. Extraocular muscle movements  normal. No pallor or icterus. Pupils bilaterally equal and reactive to light.  NECK: Supple. No thyromegaly. No palpable lymph nodes. Trachea midline. No carotid bruit or JVD.  CARDIOVASCULAR: S1, S2, without any murmurs. Peripheral pulses 2+. No edema.  RESPIRATORY: Normal work of breathing. Clear to auscultation on both sides.  GASTROINTESTINAL: Soft abdomen. Tenderness in the upper abdomen. No rigidity or guarding. Bowel sounds present. No organomegaly palpable. Murphy sign negative.  GENITOURINARY: No CVA tenderness or bladder distention.  SKIN: Warm and dry. No petechiae, rash, or ulcers.  MUSCULOSKELETAL: No joint swelling, redness, effusion of the large joints. Normal muscle tone.  NEUROLOGICAL: Motor strength 5/5 in the upper and lower extremities. Sensation to fine touch intact all over.  LYMPHATIC: No cervical lymphadenopathy.   LABORATORY STUDIES: Glucose 141, BUN 13, creatinine 0.90, sodium 136, potassium 4.1, lipase 516. AST, ALT, alkaline phosphatase, and bilirubin normal. WBC 8.7,  hemoglobin 15.5, platelets 244,000, MCV 90. Urinalysis shows 1+ bacteria, 0-5 WBCs.   Ultrasound of the abdomen showed CBD at 5 mm and chronic gallbladder polyp versus adherent stone present. No acute cholecystitis. Chronic hepatic steatosis.   ASSESSMENT  AND PLAN:  1.  Acute pancreatitis secondary to gallstones. The patient does not have any obstruction. LFTs are normal. At this point, we will manage symptomatically with pain medications and aggressive IV fluids. The patient will need a surgical appointment at discharge for an elective cholecystectomy.  2 .  Diabetes. The patient will be n.p.o. We will continue her metformin, but hold the glipizide.  3.  Seizures. Can continue Keppra.  4.  Deep vein thrombosis prophylaxis with Lovenox.    CODE STATUS: Full Code.  TIME SPENT TODAY ON  THIS CASE: 45 minutes    ____________________________ Molinda Bailiff. Johanny Segers, MD srs:MT D: 01/15/2014 15:48:18 ET T: 01/15/2014 15:57:16 ET JOB#: [id number]  cc: Wardell Heath R. Martha Ellerby, MD, <Dictator> Orie Fisherman MD ELECTRONICALLY SIGNED 01/16/2014 7:45

## 2014-05-20 LAB — SURGICAL PATHOLOGY

## 2014-05-22 NOTE — Discharge Summary (Signed)
PATIENT NAME:  Sharon HartshornDODSON, Chitara M MR#:  [id number] DATE OF BIRTH:  June 27, 1955  DATE OF ADMISSION:  01/15/2014 DATE OF DISCHARGE:  01/16/2014  DISCHARGE DIAGNOSES:  1. Gallstone pancreatitis.  2. Diabetes.  3. Seizures.  4. Gastroesophageal reflux disease.  5. Hypertension.  6. Depression.   DISCHARGE MEDICATIONS:  1. Lisinopril 2.5 mg daily.  2. Metformin 1000 mg oral 2 times a day.  3. Omeprazole 20 mg daily.  4. Citalopram 40 mg daily.  5. Propranolol 10 mg 2 times a day.  6. Glipizide 10 mg 2 times a day.  7. Prempro 0.625 mg/2.5 mg oral once a day.  8. Cymbalta 30 mg daily.  9. Keppra 750 mg 2 times a day.  10. Percocet 5/325 1 tablet orally 4 times a day as needed for pain.   DISCHARGE INSTRUCTIONS: Low fat, carb-controlled diet.   ACTIVITY: As tolerated.   FOLLOWUP: [facility] 1 or 2 weeks for an elective cholecystectomy.   IMAGING STUDIES: Right upper quadrant ultrasound showed some gallstones, no cholecystitis, normal pancreas.   CT scan of the abdomen and pelvis without contrast for stone showed no nephrolithiasis, nothing acute.   ADMITTING HISTORY AND PHYSICAL AND HOSPITAL COURSE: Please see detailed H and P dictated previously. In brief, a 59 year old patient with history of diabetes, seizures, hypertension, presented to the hospital from urgent care directly after they found her lipase was elevated and presented with some nausea, vomiting, abdominal pain. The patient was admitted for symptomatic management. Aggressive fluids were started. She was put on both oral and IV pain medications as needed. Made n.p.o. The patient's pain improved. She did have right upper quadrant ultrasound along with CT scan of the abdomen, which showed nothing acute other than some gallstones. No cholecystitis. CBD was normal. The patient was started on a diet, which she tolerated. Pain is much improved. Lipase trended down to a normal range. IV fluids have been stopped.   Prior to  discharge, the patient has mild tenderness in the right side of the abdomen. No rigidity or guarding. Bowel sounds present Murphy's negative. S1, S2 heard. Lungs clear.   The patient is being discharged in a fair condition to [facility] for her gallbladder pancreatitis.   TIME SPENT ON DAY OF DISCHARGE IN DISCHARGE ACTIVITY: 35 minutes.   ____________________________ Molinda BailiffSrikar R. Mareesa Gathright, MD srs:je D: 01/17/2014 13:21:26 ET T: 01/17/2014 14:18:47 ET JOB#: [id number]  cc: Wardell HeathSrikar R. Metztli Sachdev, MD, <Dictator> Orie FishermanSRIKAR R Kimberlyann Hollar MD ELECTRONICALLY SIGNED 02/08/2014 12:44

## 2014-05-22 NOTE — Consult Note (Signed)
PATIENT NAME:  Sharon Crawford, Sharon Crawford MR#:  [id number] DATE OF BIRTH:  1955-03-20  DATE OF CONSULTATION:  01/21/2014   CONSULTING PHYSICIAN:  Cristal Deerhristopher A. Paxton Kanaan, MD  REASON FOR CONSULTATION: Epigastric right flank pain, nausea, and vomiting.   HISTORY OF PRESENT ILLNESS: Ms. Sharon Crawford is a pleasant 59 year old with a history of diabetes, and a seizure disorder presented December 22, was later discharged for gallstone pancreatitis. She is to follow up as an outpatient. She continues to have persistent right upper quadrant pain but does not have any liver abnormalities, does got to the back, there may be a component of musculoskeletal pain from a possible seizure approximately 3 weeks ago; otherwise, no fevers, chills, night sweats, shortness of breath, cough, chest pain, diarrhea; does report constipation, dysuria, and hematuria.   PAST MEDICAL HISTORY: Gallstone pancreatitis, diabetes, seizure, gastroesophageal reflux disease, hypertension, depression.   HOME MEDICATIONS: Lisinopril 2.5 p.o. daily, metformin 1000 p.o. b.i.d., omeprazole 20 mg p.o. daily, citalopram 40 mg p.o. daily, propranolol 10 mg b.i.d., glipizide 10 mg b.i.d., Prempro, 0.65/2.5 p.o. daily, Cymbalta 30 mg p.o. daily, Keppra 750 b.i.d., Percocet 1 tab p.o. q. 4 hours p.r.n. pain.   ALLERGIES: GABAPENTIN AND TRAZODONE.   FAMILY HISTORY: Diabetes and Alzheimer's disease.   PHYSICAL EXAMINATION: VITAL SIGNS: Temperature 97.8, pulse 58, blood pressure 119/69, respirations 18.  GENERAL: No acute distress, alert and oriented x 3.  HEAD: Normocephalic, atraumatic.  EYES: No scleral icterus. No conjunctivitis.  FACE: No obvious facial trauma, normal external nose, normal external ears.  CHEST: Lungs clear to auscultation, moving air well.  HEART: Regular rate and rhythm. No murmurs, rubs, or gallops.  ABDOMEN: Soft, tender in right upper quadrant, nondistended.  EXTREMITIES: Moves all extremities well, strength 5/5.  NEUROLOGIC:  Cranial nerves II through XII grossly intact.   DIAGNOSTIC DATA: As follows, white cell count 7.0, LFTs, lipase, everything else, her CMP is normal. Ultrasound shows gallstones versus polyps, no pericholecystic fluid.   ASSESSMENT AND PLAN: Ms. Sharon Crawford is a pleasant 59 year old with right upper quadrant pain. This may be from musculoskeletal; however, due to the fact that she has recent gallstone pancreatitis, she will have to have her gallbladder out and I have offered to do this. I have informed her that this may not completely alleviate the pain.  Patient is to be n.p.o. after midnight and we will plan cholecystectomy tomorrow with cholangiogram.    ____________________________ Si Raiderhristopher A. Dene Landsberg, MD cal:nt D: 01/21/2014 16:52:24 ET T: 01/21/2014 17:33:46 ET JOB#: [id number]  cc: Cristal Deerhristopher A. Dealie Koelzer, MD, <Dictator> Jarvis NewcomerHRISTOPHER A Jaid Quirion MD ELECTRONICALLY SIGNED 02/05/2014 19:47

## 2014-05-22 NOTE — Op Note (Signed)
PATIENT NAME:  Sharon Crawford, Sharon Crawford MR#:  [id number] DATE OF BIRTH:  1955-06-20  DATE OF PROCEDURE:  01/22/2014  ATTENDING SURGEON: Cristal Deerhristopher A. Jedediah Noda, MD   PREOPERATIVE DIAGNOSES: Back pain, history of gallstone pancreatitis.   POSTOPERATIVE DIAGNOSES: Back pain, history of gallstone pancreatitis, chronic cholecystitis.   ANESTHESIA: General.   ESTIMATED BLOOD LOSS: 25 mL.   COMPLICATIONS: None.    SPECIMENS: Gallbladder.   INDICATION FOR SURGERY: Sharon Crawford is a pleasant 59 year old with history of gallstones and pancreatitis likely due to the gallstones. She presented with pain and therefore I was consulted for cholecystectomy as she would have to have it anyway and I felt that her pain did appear to be somewhat biliary in nature with gallstones.    DETAILS OF PROCEDURE: As follows: Informed consent was obtained. Sharon Crawford was brought to the operating room suite. She was induced. Endotracheal tube was placed, general anesthesia was administered. Her abdomen was prepped and draped in standard surgical fashion. A timeout was then performed correctly identifying the patient name, operative site and procedure to be performed. Supraumbilical incision was made deep down to the fascia. The fascia was incised. The peritoneum was entered. Two stay sutures were placed through the fasciotomy. Hassan trocar was placed in the abdomen. The abdomen was insufflated. An 11 mm epigastric and two 5 mm right subcostal trocars were placed. The gallbladder was then lifted over the dome of the liver and cystic duct and cystic artery were dissected out. The critical view was obtained. A clip was placed across the proximal cystic duct and was clipped partially. A cholangiogram catheter was then placed and the balloon was insufflated. A cholangiogram was performed which showed easy passage of contrast into the duodenum and I was able to, with an effort, visualize most of the biliary tree; however, the contrast, the  more I injected the more it went out into the duodenum and I did not want to inject extra hard to get in fear of giving her pancreatitis. After I performed the cholangiogram, I used a laparoscopic stapler to ligate the cystic duct and the cystic artery were then clipped 3 times and ligated. The gallbladder was then taken off the gallbladder fossa and out through an Endo Catch bag. The gallbladder fossa was examined for hemostasis. After hemostasis was obtained and the abdomen was irrigated, all trocars were removed under direct visualization. The previously placed stay sutures were used to close the supraumbilical fascia. Then, 4-0 Monocryl deep dermal sutures were used to close the skin. Steri-Strips, Telfa, gauze and Tegaderm were used to complete the dressing. The patient was then awoken, extubated and brought to postanesthesia care unit. There were no immediate complications. Needle, sponge, and instrument counts were correct at the end of the procedure.    ____________________________ Si Raiderhristopher A. Rayette Mogg, MD cal:TT D: 01/22/2014 16:25:00 ET T: 01/22/2014 16:45:41 ET JOB#: [id number]  cc: Cristal Deerhristopher A. Obryan Radu, MD, <Dictator> Jarvis NewcomerHRISTOPHER A Chasady Longwell MD ELECTRONICALLY SIGNED 02/05/2014 19:47

## 2014-07-02 ENCOUNTER — Ambulatory Visit: Payer: No Typology Code available for payment source | Admitting: Endocrinology

## 2014-07-23 ENCOUNTER — Other Ambulatory Visit: Payer: Self-pay | Admitting: Family Medicine

## 2014-07-23 DIAGNOSIS — K219 Gastro-esophageal reflux disease without esophagitis: Secondary | ICD-10-CM

## 2014-08-08 ENCOUNTER — Other Ambulatory Visit: Payer: Self-pay | Admitting: Endocrinology

## 2014-08-26 ENCOUNTER — Other Ambulatory Visit: Payer: Self-pay | Admitting: Family Medicine

## 2014-08-26 DIAGNOSIS — I10 Essential (primary) hypertension: Secondary | ICD-10-CM

## 2014-09-22 ENCOUNTER — Other Ambulatory Visit: Payer: Self-pay | Admitting: Family Medicine

## 2014-09-22 DIAGNOSIS — I10 Essential (primary) hypertension: Secondary | ICD-10-CM

## 2014-10-12 ENCOUNTER — Other Ambulatory Visit: Payer: Self-pay | Admitting: Family Medicine

## 2014-12-16 ENCOUNTER — Other Ambulatory Visit: Payer: Self-pay | Admitting: Family Medicine

## 2015-10-07 ENCOUNTER — Other Ambulatory Visit: Payer: Self-pay | Admitting: Family Medicine

## 2015-10-07 DIAGNOSIS — Z1231 Encounter for screening mammogram for malignant neoplasm of breast: Secondary | ICD-10-CM

## 2015-10-09 ENCOUNTER — Ambulatory Visit
Admission: RE | Admit: 2015-10-09 | Discharge: 2015-10-09 | Disposition: A | Discharge status: Home / Self Care | Payer: BLUE CROSS/BLUE SHIELD | Source: Ambulatory Visit | Attending: Family Medicine | Admitting: Family Medicine

## 2015-10-09 DIAGNOSIS — Z1231 Encounter for screening mammogram for malignant neoplasm of breast: Secondary | ICD-10-CM | POA: Diagnosis not present

## 2015-10-10 ENCOUNTER — Other Ambulatory Visit: Payer: Self-pay | Admitting: *Deleted

## 2015-10-10 ENCOUNTER — Inpatient Hospital Stay
Admission: RE | Admit: 2015-10-10 | Discharge: 2015-10-10 | Disposition: A | Discharge status: Home / Self Care | Payer: Self-pay | Source: Ambulatory Visit | Attending: *Deleted | Admitting: *Deleted

## 2015-10-10 DIAGNOSIS — Z9289 Personal history of other medical treatment: Secondary | ICD-10-CM

## 2016-09-28 IMAGING — CT CT HEAD WITHOUT CONTRAST
1 series · 16 of 30 positions shown, 20 images · non-contrast
Comparison: None.

CLINICAL DATA: Seizure.

EXAM:
CT HEAD WITHOUT CONTRAST
TECHNIQUE: Contiguous axial images were obtained from the base of the skull
through the vertex without intravenous contrast.

[Series 2: head wo · axial · 0.41mm/px · z∈[-160,-16]mm · 16 of 36 slices shown, 20 images]
[im 2/36  brain]
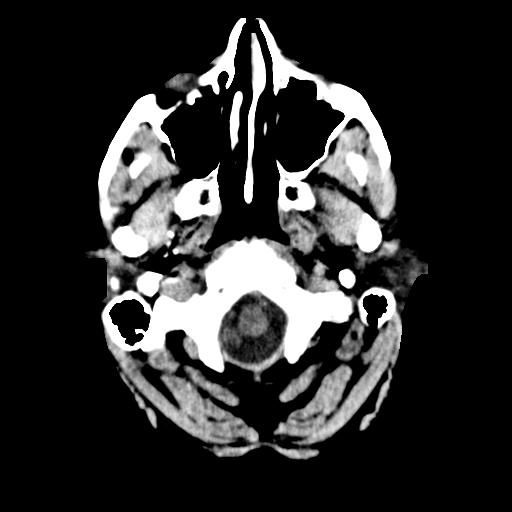
[im 2/36  bone]
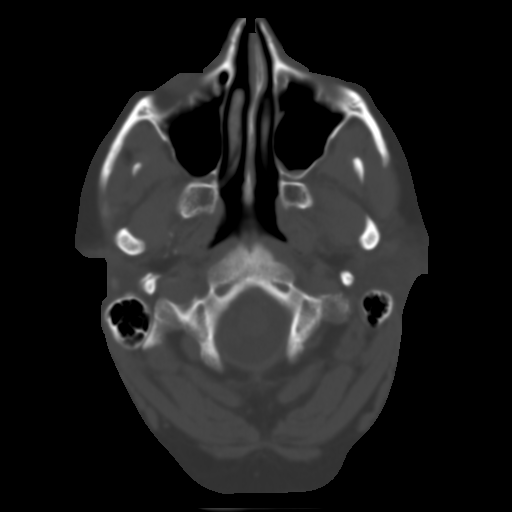
[im 4/36  brain]
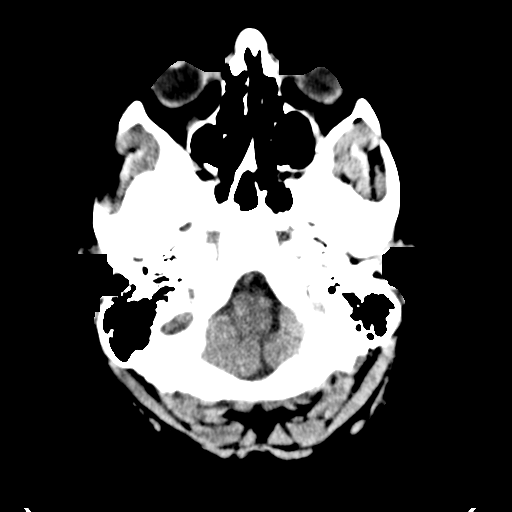
[im 7/36  brain]
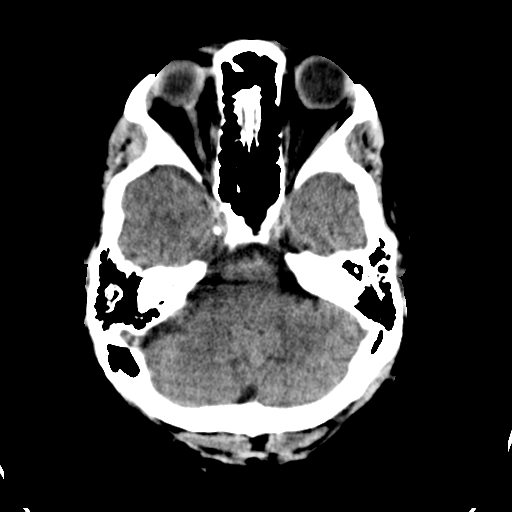
[im 9/36  brain]
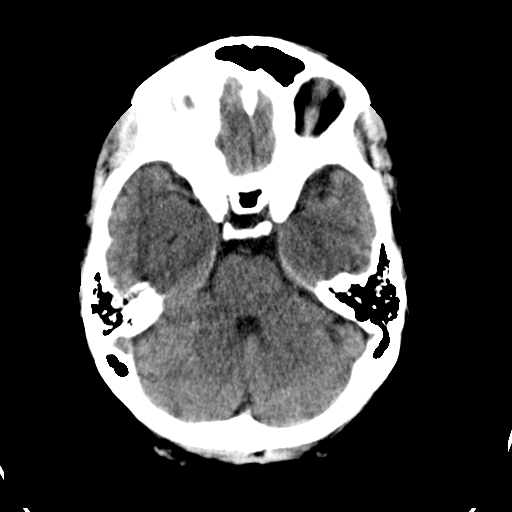
[im 10/36  brain]
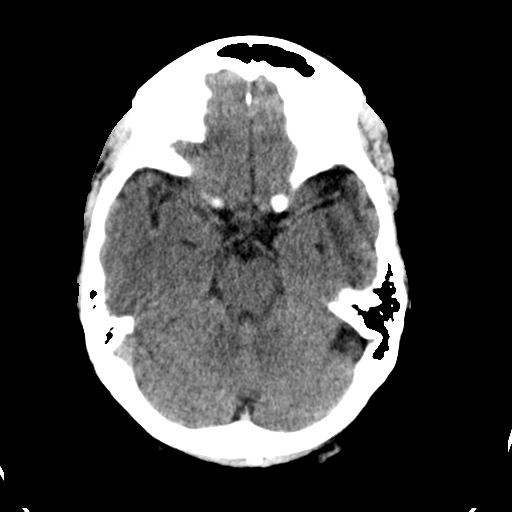
[im 10/36  bone]
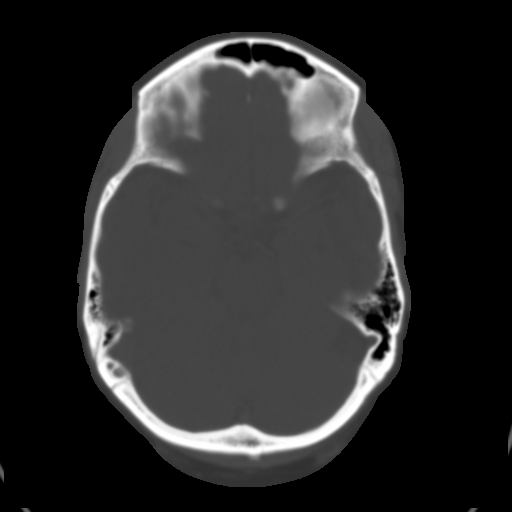
[im 13/36  brain]
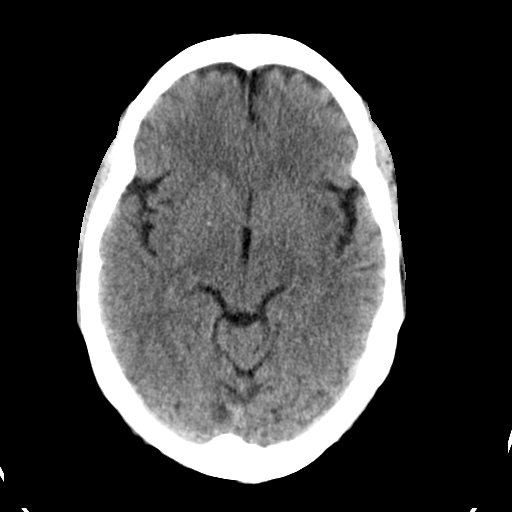
[im 15/36  brain]
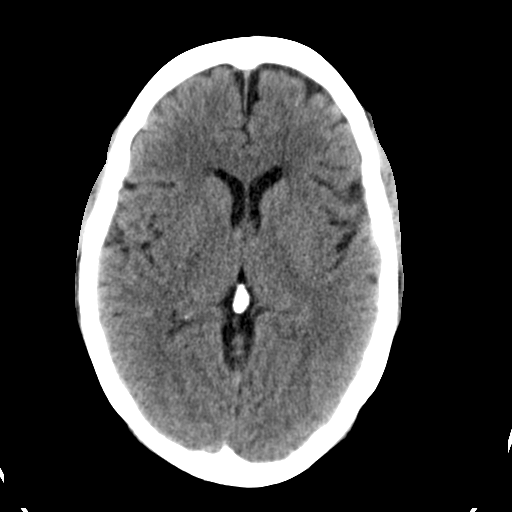
[im 17/36  brain]
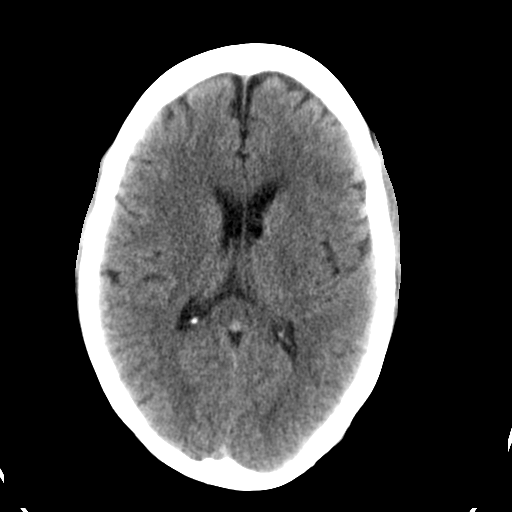
[im 19/36  brain]
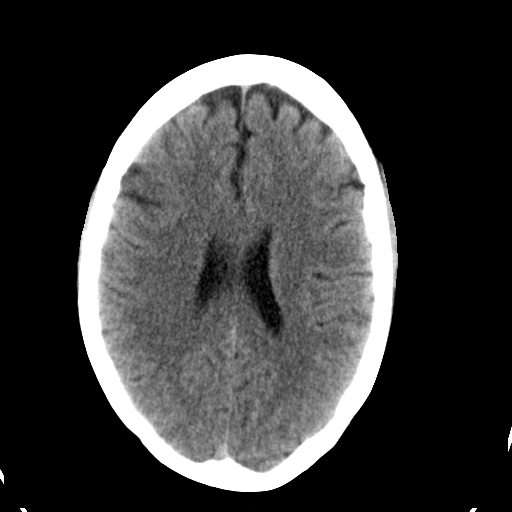
[im 19/36  bone]
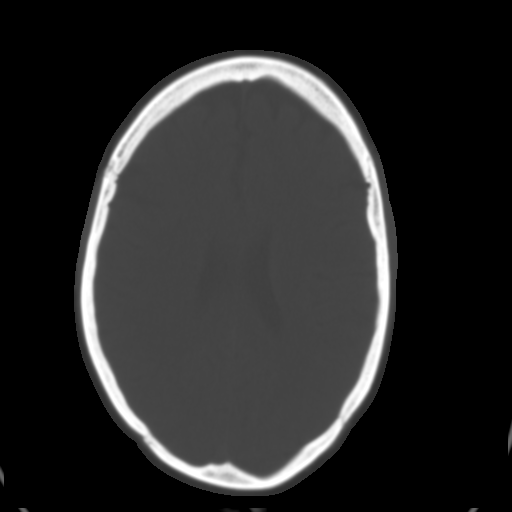
[im 21/36  brain]
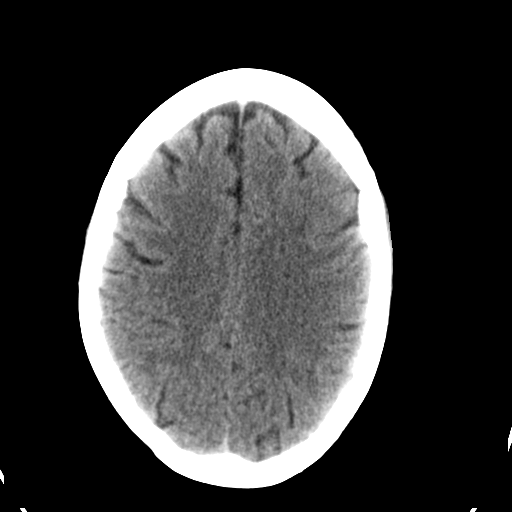
[im 23/36  brain]
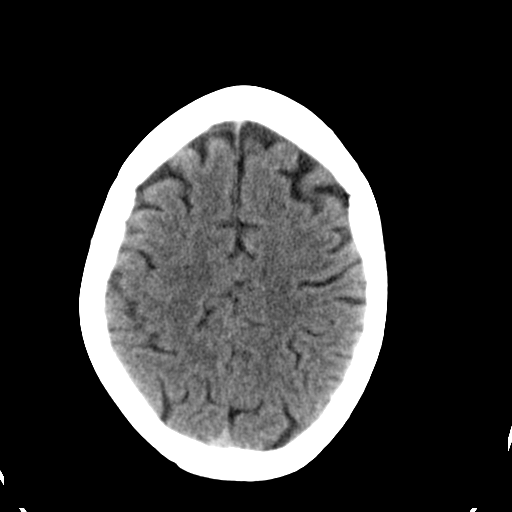
[im 26/36  brain]
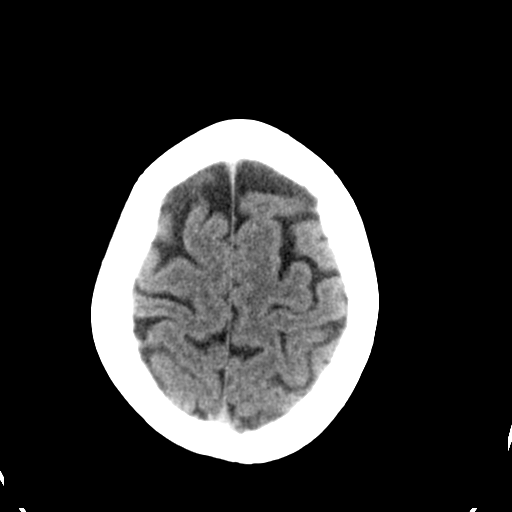
[im 27/36  brain]
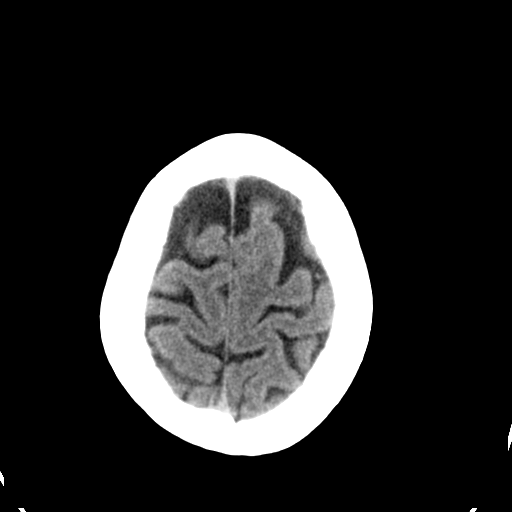
[im 27/36  bone]
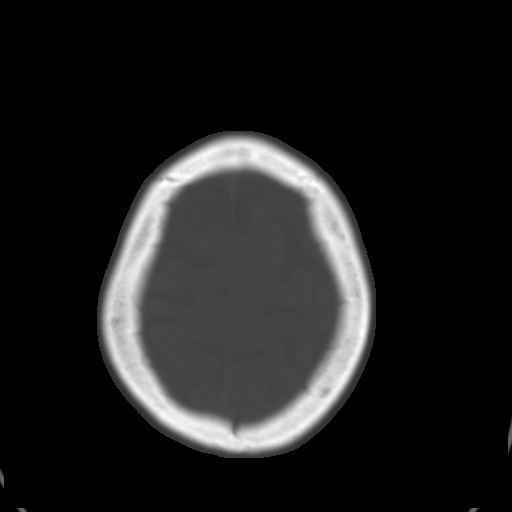
[im 29/36  brain]
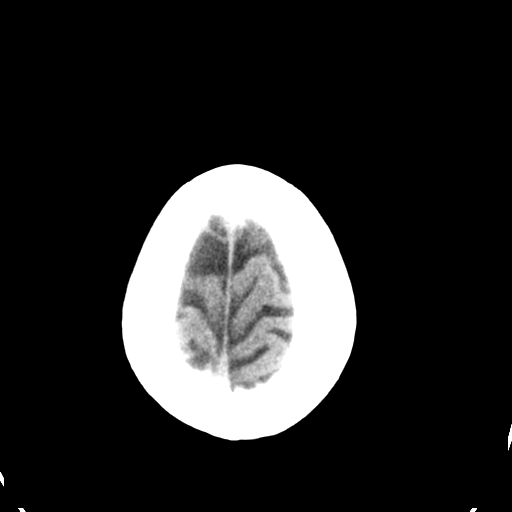
[im 32/36  brain]
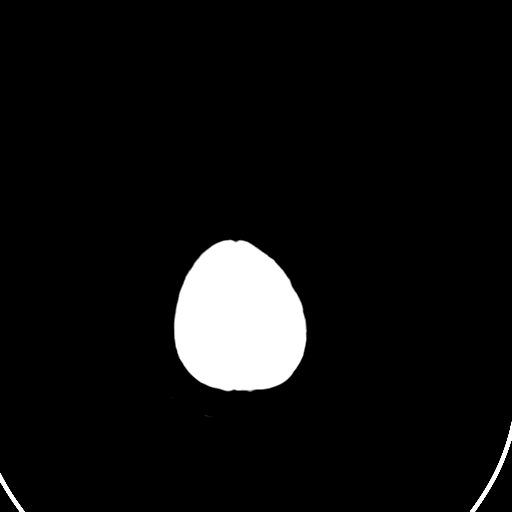
[im 34/36  brain]
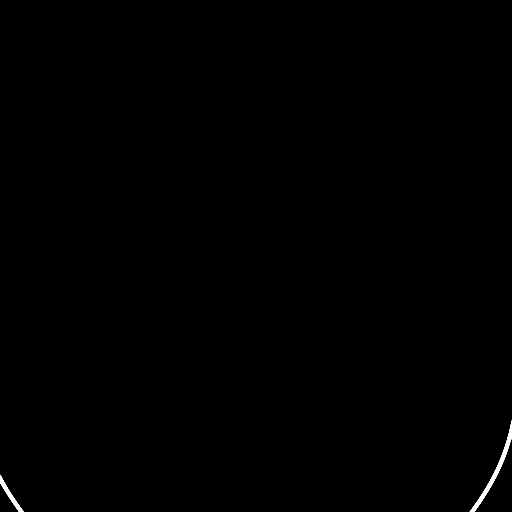

[16 of 30 positions shown; findings below may reference images not displayed]

FINDINGS: Bony calvarium appears intact. Minimal chronic ischemic white matter
disease is noted. Minimal diffuse cortical atrophy is noted. No mass
effect or midline shift is noted. Ventricular size is within normal
limits. There is no evidence of mass lesion, hemorrhage or acute
infarction.
IMPRESSION: Minimal chronic ischemic white matter disease. Minimal diffuse
cortical atrophy. No acute intracranial abnormality seen.

## 2016-11-23 IMAGING — CR DG CHOLANGIOGRAM OPERATIVE
1 series · 1 of 1 positions shown · non-contrast
Comparison: Abdominal ultrasound - 01/21/2014; CT abdomen pelvis -
01/16/2014

CLINICAL DATA: Laparoscopic cholecystectomy.

EXAM:
INTRAOPERATIVE CHOLANGIOGRAM
FLUOROSCOPY TIME:  29 seconds

[lun 2]
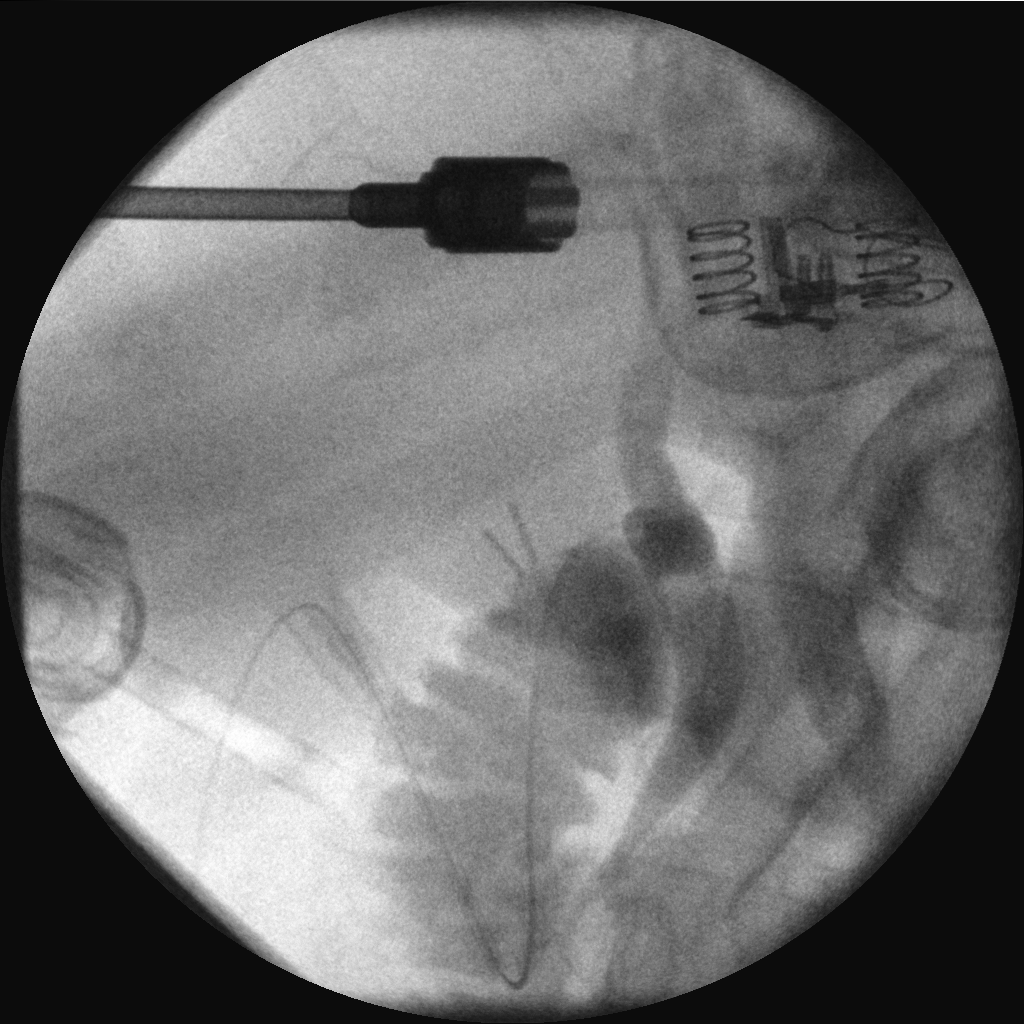

[1 of 1 positions shown; findings below may reference images not displayed]

FINDINGS: Intraoperative angiographic images of the right upper abdominal
quadrant during laparoscopic cholecystectomy are provided for
review.

Surgical clips overlie the expected location of the gallbladder
fossa.

Contrast injection demonstrates apparent selective cannulation of
the central aspect of the cystic duct, though evaluation degraded
due obliquity

There is passage of contrast through the central aspect of the
cystic duct with filling of a mildly dilated common bile duct. There
is passage of contrast though the CBD and into the descending
portion of the duodenum.

The CBD is suboptimally opacified though there are no definitive
discrete filling defects within the opacified portions of the
biliary system to suggest the presence of choledocholithiasis.
IMPRESSION: Suboptimal opacification the CBD without definite evidence of
choledocholithiasis.

## 2022-06-08 ENCOUNTER — Other Ambulatory Visit: Payer: Self-pay | Admitting: Nurse Practitioner

## 2022-06-08 DIAGNOSIS — Z1231 Encounter for screening mammogram for malignant neoplasm of breast: Secondary | ICD-10-CM

## 2023-01-27 ENCOUNTER — Other Ambulatory Visit: Payer: Self-pay | Admitting: Family Medicine

## 2023-01-27 DIAGNOSIS — E049 Nontoxic goiter, unspecified: Secondary | ICD-10-CM

## 2023-02-23 ENCOUNTER — Ambulatory Visit
Admission: RE | Admit: 2023-02-23 | Discharge: 2023-02-23 | Disposition: A | Discharge status: Home / Self Care | Payer: Medicare Other | Source: Ambulatory Visit | Attending: Family Medicine | Admitting: Family Medicine

## 2023-02-23 DIAGNOSIS — E049 Nontoxic goiter, unspecified: Secondary | ICD-10-CM | POA: Insufficient documentation

## 2023-12-09 NOTE — Progress Notes (Signed)
 Referring Physician:  Zachary Idelia LABOR, MD [address] [place],  [place] [address]  Primary Physician:  Zachary Idelia LABOR, MD  Discussed the use of AI scribe software for clinical note transcription with the patient, who gave verbal consent to proceed.  History of Present Illness Sharon Crawford is a 67 year old female with diabetes who presents with severe back and left leg pain. She was referred by Dr. Zachary due to concerns about pain management and potential red flags in her medication history.  The pain began suddenly and worsened significantly, leading her to seek emergency care. She noted a popping sensation in the back of her leg, while getting out of the bathtub was followed by severe pain. It was preventing her from sleeping in a bed and requiring her to sleep in a chair for four weeks. The pain radiates from her back down her left leg to her knee, not below it, often worsening at night. She has visited the emergency room twice due to the severity of the pain.   She has been using muscle relaxers and oxycodone, with an lumbar MRI performed. She continues to take muscle relaxers at night and uses patches on her hip area. Chiropractic care over three weeks has alleviated most of her pain.  She experiences a mild amount of swelling in her legs and has a history of artificial knees. She reports minimal neuropathy symptoms from diabetes, with slight numbness in her feet. No significant numbness or burning in her foot related to the current pain episode.  Episodes of uncontrolled bladder have occurred, which were concerning to her chiropractor. These episodes were intermittent and seemed related to severe pain.  Notably she has not noticed any loss of sensation, numbess or tingling in her leg.   Conservative measures:  Physical therapy:  has not participated in PT. Has been seeing a [facility] medical therapy including regular antiinflammatories:  Oxycodone,  Cymbalta, Tylenol , Robaxin, Lidocaine patch, Prednisone Injections: no epidural steroid injections  Past Surgery: none  The symptoms are causing a significant impact on the patient's life.   I have utilized the care everywhere function in epic to review the outside records available from external health systems.  Review of Systems:  A 10 point review of systems is negative, except for the pertinent positives and negatives detailed in the HPI.  Past Medical History: Past Medical History:  Diagnosis Date   Anxiety    Cervical disc displacement    Depression    Diabetes mellitus without complication (HCC)    Gallstone pancreatitis    GERD (gastroesophageal reflux disease)    Hyperlipidemia    Hypertension    Noncompliance with medication regimen    Seizures (HCC)    Vulvovaginal candidiasis     Past Surgical History: Past Surgical History:  Procedure Laterality Date   CHOLECYSTECTOMY     TUBAL LIGATION      Allergies: Allergies as of 12/12/2023 - Review Complete 03/28/2014  Allergen Reaction Noted   Trazodone  02/27/2014    Medications:  Current Outpatient Medications:    citalopram (CELEXA) 40 MG tablet, Take 40 mg by mouth daily., Disp: , Rfl: 1   DULoxetine (CYMBALTA) 30 MG capsule, Take 30 mg by mouth daily., Disp: , Rfl:    estradiol (ESTRACE) 0.5 MG tablet, Take 0.5 mg by mouth at bedtime., Disp: , Rfl: 2   estrogen, conjugated,-medroxyprogesterone (PREMPRO) 0.625-2.5 MG per tablet, Take 1 tablet by mouth daily., Disp: , Rfl:  glipiZIDE (GLUCOTROL) 10 MG tablet, Take 5 mg by mouth daily., Disp: , Rfl: 1   levETIRAcetam (KEPPRA) 750 MG tablet, Take 750 mg by mouth 2 (two) times daily., Disp: , Rfl:    lisinopril (PRINIVIL,ZESTRIL) 2.5 MG tablet, TAKE 1 TABLET BY MOUTH EVERY DAY *NEEDS APPT*, Disp: 7 tablet, Rfl: 0   metFORMIN  (GLUMETZA ) 500 MG (MOD) 24 hr tablet, Take 1000 mg in the morning and 500 mg at night., Disp: 90 tablet, Rfl: 3   omeprazole (PRILOSEC)  20 MG capsule, TAKE ONE CAPSULE BY MOUTH EVERY DAY, Disp: 30 capsule, Rfl: 1   propranolol (INDERAL) 20 MG tablet, Take 20 mg by mouth 2 (two) times daily., Disp: , Rfl: 6  Social History: Social History   Tobacco Use   Smoking status: Never   Smokeless tobacco: Never  Substance Use Topics   Alcohol use: No    Alcohol/week: 0.0 standard drinks of alcohol   Drug use: Yes    Frequency: 7.0 times per week    Types: Marijuana    Comment: patient reports smoke marijuana daily    Family Medical History: Family History  Problem Relation Age of Onset   Diabetes Father    Diabetes Sister    Diabetes Brother    Breast cancer Neg Hx     Physical Examination:  NEUROLOGICAL:     Awake, alert, oriented to person, place, and time.  Speech is clear and fluent.   Cranial Nerves: Pupils equal round and reactive to light.  Facial tone is symmetric.  Facial sensation is symmetric. Shoulder shrug is symmetric. Tongue protrusion is midline.    Strength:  Side Iliopsoas Quads Hamstring PF DF EHL  R 5 5 5 5 5 5   L 5 5 5 5 5 5    Reflexes are trace throughout.   Severe restriction in internal rotation of left hip, severe pain in left greater trochanter area, severe pain in ischial tuberosity that does cause some radiation of pain.   Bilateral upper and lower extremity sensation is intact to light touch.  Imaging: EXAM: MRI lumbar spine without contrast   HISTORY: Radiculopathy, minor trauma, L5 L radiculopathy after fall.   TECHNIQUE: Sagittal T2-weighted, sagittal T1-weighted, sagittal STIR, axial  T2-weighted, axial proton density weighted, and coronal T1-weighted MR  images of the lumbar spine were obtained.   COMPARISON: Radiographs dated 11/16/2023   FINDINGS: Comparison with radiographs demonstrate 5 nonrib-bearing lumbar  vertebral bodies. The conus medullaris terminates at the inferior aspect of  L1. No abnormalities the visualized distal spinal cord. No fractures are   noted.   L1-2: Desiccation of disc. Anterior endplate osteophyte and anterior disc  bulge. Degenerative facet hypertrophy. No spinal canal or neuroforaminal  narrowing.   L2-3: Desiccation of the disc. Middle degenerative Schmorl's nodes. Minimal  grade 1 retrolisthesis of L2 on L3. Anterior endplate osteophyte.  Degenerative facet operative. Mild ligament flavum hypertrophy. Mild  circumferential disc bulge. Overall mild central spinal canal narrowing  with mild narrowing of the right lateral recess. Moderate narrowing of the  left lateral recess. The bulging disc contacts but does not impinge the  descending left L3 nerve root. Moderate bilateral neuroforaminal narrowing.   L3-4: Desiccation of disc and circumferential mild disc bulge. Minimal  anterior endplate osteophyte. Degenerative facet hypertrophy. Mild ligament  flavum hypertrophy. There is mild central spinal canal narrowing and  moderate narrowing of the lateral recesses. At the level of the L4  vertebral body, there is obliteration of the posterior fat pad  due to facet  and ligamentum flavum hypertrophy. There is moderate to severe narrowing of  the right neural foramen and moderate narrowing of the left neural foramen.  There is asymmetry of the bulging disc with a question of a possible right  foraminal disc herniation versus volume averaging. The bulging disc  contacts the descending right L4 nerve root but does not definitively  impinge the nerve root.   L4-5: Desiccation of disc. Minimal endplate osteophytes of the inferior  aspect of L4. Minimal grade 1 anterolisthesis of L4 on L5. Minimal  circumferential disc bulge. Severe degenerative facet hypertrophy. Minimal  central spinal canal narrowing and mild narrowing of the lateral recesses.  Moderate to severe bilateral neuroforaminal narrowing.   L5-S1: Endplate osteophyte and mild circumferential disc bulge.  Degenerative facet change. Overall minimal relative  central spinal canal  narrowing. There is moderate bilateral neuroforaminal narrowing.    IMPRESSION:   1. No evidence of severe spinal canal stenosis of the lumbar spine.  2. Grade 1 anterolisthesis of L4 on L5. Grade 1 retrolisthesis of L2 on L3.  3. Mild spinal canal narrowing at L3-L4, L2-L3, and L4-L5 and minimal  relative central spinal canal narrowing at L5-S1.  4. Moderate to severe neuroforaminal narrowing at L4-5. Moderate  neuroforaminal narrowing at multiple other levels.  5. No acute fracture of the lumbar spine.  6. Degenerative changes throughout the lumbar spine with marked  degenerative facet arthropathy and multilevel disc bulges as described  above.  7. Questionable small right foraminal disc herniation at L3-L4 versus  asymmetric disc bulge in this location.  8. The bulging discs contact the descending right L4 nerve root and the  descending left L3 nerve root without definitive impingement.    Electronically Signed by:  Rosina Gold, MD, [facility]  Electronically Signed on:  11/19/2023 10:44 AM  I have personally reviewed the images and agree with the above interpretation.  Assessment and Plan Assessment & Plan Severe left lower extremity pain without clear neurological involvement or localization. With her history of popping/tearing sensation in the posterior aspect of her left leg while trying to step up out of her bath, concern for msk issue. Consider possible high hamstring injury given patietn reported severe tightness in left hamstring ever since this started. Pain at ischial tuberosity supports possible high hamstring tear. Consider further workup with orthopedics  Left hip trochanteric bursitis Possible bursitis due to localized hip pain without radiculopathy.  Lumbar spine osteoarthritis Mild nerve impingement on MRI without significant radiculopathy, mri shows much worse right sided disease than left sdied. Improvement, lack of numbness,  weakness, or tinging/paresthesias suggests non-neurological pain cause. - Will Defer further spine/nerve based workup at this time   Penne MICAEL Sharps MD/MSCR Neurosurgery

## 2023-12-12 ENCOUNTER — Ambulatory Visit

## 2023-12-12 ENCOUNTER — Encounter: Payer: Self-pay | Admitting: Neurosurgery

## 2023-12-12 ENCOUNTER — Ambulatory Visit
Admission: RE | Admit: 2023-12-12 | Discharge: 2023-12-12 | Disposition: A | Discharge status: Home / Self Care | Payer: Self-pay | Source: Ambulatory Visit | Attending: Neurosurgery | Admitting: Neurosurgery

## 2023-12-12 ENCOUNTER — Ambulatory Visit: Admitting: Neurosurgery

## 2023-12-12 ENCOUNTER — Ambulatory Visit
Admission: RE | Admit: 2023-12-12 | Discharge: 2023-12-12 | Disposition: A | Payer: Self-pay | Source: Ambulatory Visit | Attending: Neurosurgery | Admitting: Neurosurgery

## 2023-12-12 ENCOUNTER — Other Ambulatory Visit: Payer: Self-pay | Admitting: Family Medicine

## 2023-12-12 VITALS — BP 136/72 | Ht 67.0 in | Wt 226.0 lb

## 2023-12-12 DIAGNOSIS — Z049 Encounter for examination and observation for unspecified reason: Secondary | ICD-10-CM

## 2023-12-12 DIAGNOSIS — M5442 Lumbago with sciatica, left side: Secondary | ICD-10-CM

## 2023-12-12 DIAGNOSIS — M4316 Spondylolisthesis, lumbar region: Secondary | ICD-10-CM

## 2023-12-12 DIAGNOSIS — M47816 Spondylosis without myelopathy or radiculopathy, lumbar region: Secondary | ICD-10-CM | POA: Diagnosis not present

## 2023-12-12 DIAGNOSIS — M7062 Trochanteric bursitis, left hip: Secondary | ICD-10-CM

## 2023-12-12 DIAGNOSIS — G8929 Other chronic pain: Secondary | ICD-10-CM | POA: Diagnosis not present
